# Patient Record
Sex: Female | Born: 1975 | Race: White | Hispanic: No | Marital: Married | Smoking: Former smoker
Health system: Southern US, Community
[De-identification: ages and names within clinical notes are randomized; demographics above are authoritative.]

## PROBLEM LIST (undated history)

## (undated) DIAGNOSIS — G43909 Migraine, unspecified, not intractable, without status migrainosus: Secondary | ICD-10-CM

## (undated) DIAGNOSIS — F329 Major depressive disorder, single episode, unspecified: Secondary | ICD-10-CM

## (undated) DIAGNOSIS — F419 Anxiety disorder, unspecified: Secondary | ICD-10-CM

## (undated) DIAGNOSIS — F32A Depression, unspecified: Secondary | ICD-10-CM

## (undated) HISTORY — PX: CHOLECYSTECTOMY: SHX55

---

## 2018-05-01 ENCOUNTER — Ambulatory Visit
Admission: EM | Admit: 2018-05-01 | Discharge: 2018-05-01 | Disposition: A | Discharge status: Home / Self Care | Payer: Self-pay | Attending: Family Medicine | Admitting: Family Medicine

## 2018-05-01 ENCOUNTER — Other Ambulatory Visit: Payer: Self-pay

## 2018-05-01 ENCOUNTER — Encounter: Payer: Self-pay | Admitting: Emergency Medicine

## 2018-05-01 DIAGNOSIS — R05 Cough: Secondary | ICD-10-CM

## 2018-05-01 DIAGNOSIS — R059 Cough, unspecified: Secondary | ICD-10-CM

## 2018-05-01 HISTORY — DX: Major depressive disorder, single episode, unspecified: F32.9

## 2018-05-01 HISTORY — DX: Anxiety disorder, unspecified: F41.9

## 2018-05-01 HISTORY — DX: Depression, unspecified: F32.A

## 2018-05-01 HISTORY — DX: Migraine, unspecified, not intractable, without status migrainosus: G43.909

## 2018-05-01 MED ORDER — HYDROCOD POLST-CPM POLST ER 10-8 MG/5ML PO SUER: 5.0000 mL | Freq: Every evening | ORAL | 0 refills | Status: AC | PRN

## 2018-05-01 NOTE — ED Provider Notes (Signed)
[facility]  CSN: [id number] Arrival date & time: 05/01/18  1243  History   Chief Complaint Chief Complaint  Patient presents with  . Cough    APPT   HPI  42 year old female presents with cough.  Patient reports a 4 to 5-day history of dry cough.  No fever.  No shortness of breath.  Worse at night.  She has tried over-the-counter NyQuil and Robitussin without relief.  No known exacerbating factors.  No other associated symptoms.  No other complaints.  Past Medical History:  Diagnosis Date  . Anxiety   . Depression   . Migraine    Past Surgical History:  Procedure Laterality Date  . CHOLECYSTECTOMY     OB History   None    Home Medications    Prior to Admission medications   Medication Sig Start Date End Date Taking? Authorizing Provider  Aspirin-Acetaminophen-Caffeine (EXCEDRIN EXTRA STRENGTH PO) Take 2 capsules by mouth 2 (two) times daily as needed.   Yes [provider]  clonazePAM (KLONOPIN) 0.5 MG tablet Take 0.5 mg by mouth 3 (three) times daily as needed for anxiety.   Yes [provider]  fluticasone (FLONASE) 50 MCG/ACT nasal spray Place 2 sprays into both nostrils daily as needed for allergies or rhinitis.   Yes [provider]  levonorgestrel (MIRENA) 20 MCG/24HR IUD 1 each by Intrauterine route once.   Yes [provider]  SUMAtriptan (IMITREX) 25 MG tablet Take 25 mg by mouth every 2 (two) hours as needed for migraine. May repeat in 2 hours if headache persists or recurs.   Yes [provider]  venlafaxine XR (EFFEXOR-XR) 75 MG 24 hr capsule Take 150 mg by mouth daily with breakfast.   Yes [provider]  chlorpheniramine-HYDROcodone (TUSSIONEX PENNKINETIC ER) 10-8 MG/5ML SUER Take 5 mLs by mouth at bedtime as needed. 05/01/18   Tommie Sams, DO   Family History Family History  Problem Relation Age of Onset  . Hypertension Mother   . Hyperlipidemia Mother   . Depression Mother   . Prostate  cancer Father   . Kidney cancer Father   . Skin cancer Father    Social History Social History   Tobacco Use  . Smoking status: Former Smoker    Last attempt to quit: 05/01/2014    Years since quitting: 4.0  . Smokeless tobacco: Never Used  Substance Use Topics  . Alcohol use: Never    Frequency: Never  . Drug use: Never   Allergies   Prednisone; Sulfa antibiotics; and Wellbutrin [bupropion]  Review of Systems Review of Systems  Constitutional: Negative for fever.  Respiratory: Positive for cough.    Physical Exam Triage Vital Signs ED Triage Vitals [05/01/18 1301]  Enc Vitals Group     BP (!) 135/96     Pulse Rate 91     Resp 16     Temp 98.4 F (36.9 C)     Temp Source Oral     SpO2 100 %     Weight 162 lb (73.5 kg)     Height  (1.626 m)     Head Circumference      Peak Flow      Pain Score 1     Pain Loc      Pain Edu?      Excl. in GC?    Updated Vital Signs BP (!) 135/96 (BP Location: Left Arm)   Pulse 91   Temp 98.4 F (36.9 C) (Oral)  Resp 16   Ht  (1.626 m)   Wt 162 lb (73.5 kg)   SpO2 100%   BMI 27.81 kg/m   Physical Exam  Constitutional: She is oriented to person, place, and time. She appears well-developed. No distress.  HENT:  Head: Normocephalic and atraumatic.  Mouth/Throat: Oropharynx is clear and moist.  Cardiovascular: Normal rate and regular rhythm.  Pulmonary/Chest: Effort normal and breath sounds normal. She has no wheezes. She has no rales.  Neurological: She is alert and oriented to person, place, and time.  Psychiatric: She has a normal mood and affect. Her behavior is normal.  Vitals reviewed.  UC Treatments / Results  Labs (all labs ordered are listed, but only abnormal results are displayed) Labs Reviewed - No data to display  EKG None  Radiology No results found.  Procedures Procedures (including critical care time)  Medications Ordered in UC Medications - No data to display  Initial Impression /  Assessment and Plan / UC Course  I have reviewed the triage vital signs and the nursing notes.  Pertinent labs & imaging results that were available during my care of the patient were reviewed by me and considered in my medical decision making (see chart for details).    42 year old female presents with cough.  No indication for antibiotic.  Treating with Tussionex.  Final Clinical Impressions(s) / UC Diagnoses   Final diagnoses:  Cough   Discharge Instructions   None    ED Prescriptions    Medication Sig Dispense Auth. Provider   chlorpheniramine-HYDROcodone (TUSSIONEX PENNKINETIC ER) 10-8 MG/5ML SUER Take 5 mLs by mouth at bedtime as needed. 60 mL Tommie Sams, DO     Controlled Substance Prescriptions [place] Controlled Substance Registry consulted? Not Applicable   Tommie Sams, DO 05/01/18 1438

## 2018-05-01 NOTE — ED Triage Notes (Signed)
Patient is in today c/o 4-5 day history of cough, which is worse at night. Patient has tried OTC Nyquil and Robitussin without relief. Patient has felt feverish, but hasn't had a temperature.

## 2021-02-21 ENCOUNTER — Other Ambulatory Visit: Payer: Self-pay

## 2021-02-21 ENCOUNTER — Emergency Department: Payer: Medicaid Other

## 2021-02-21 ENCOUNTER — Emergency Department
Admission: EM | Admit: 2021-02-21 | Discharge: 2021-02-21 | Disposition: A | Discharge status: Home / Self Care | Payer: Medicaid Other | Attending: Emergency Medicine | Admitting: Emergency Medicine

## 2021-02-21 DIAGNOSIS — G4486 Cervicogenic headache: Secondary | ICD-10-CM

## 2021-02-21 DIAGNOSIS — Y92009 Unspecified place in unspecified non-institutional (private) residence as the place of occurrence of the external cause: Secondary | ICD-10-CM | POA: Diagnosis not present

## 2021-02-21 DIAGNOSIS — M542 Cervicalgia: Secondary | ICD-10-CM | POA: Diagnosis not present

## 2021-02-21 DIAGNOSIS — R079 Chest pain, unspecified: Secondary | ICD-10-CM | POA: Diagnosis not present

## 2021-02-21 DIAGNOSIS — Z87891 Personal history of nicotine dependence: Secondary | ICD-10-CM | POA: Insufficient documentation

## 2021-02-21 DIAGNOSIS — Z7982 Long term (current) use of aspirin: Secondary | ICD-10-CM | POA: Diagnosis not present

## 2021-02-21 MED ORDER — ACETAMINOPHEN 325 MG PO TABS
650.0000 mg | ORAL_TABLET | Freq: Once | ORAL | Status: AC
  Administered 2021-02-21: 650 mg via ORAL
  Filled 2021-02-21: qty 2

## 2021-02-21 MED ORDER — CYCLOBENZAPRINE HCL 10 MG PO TABS
5.0000 mg | ORAL_TABLET | Freq: Once | ORAL | Status: AC
  Administered 2021-02-21: 5 mg via ORAL
  Filled 2021-02-21: qty 1

## 2021-02-21 MED ORDER — MELOXICAM 15 MG PO TABS: 15.0000 mg | ORAL_TABLET | Freq: Every day | ORAL | 0 refills | Status: AC

## 2021-02-21 MED ORDER — METHOCARBAMOL 500 MG PO TABS: 500.0000 mg | ORAL_TABLET | Freq: Four times a day (QID) | ORAL | 0 refills | Status: AC

## 2021-02-21 MED ORDER — MELOXICAM 7.5 MG PO TABS
15.0000 mg | ORAL_TABLET | Freq: Once | ORAL | Status: AC
  Administered 2021-02-21: 15 mg via ORAL
  Filled 2021-02-21: qty 2
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
## 2021-02-21 NOTE — Discharge Instructions (Signed)
Please take the antiinflammatory and muscle relaxant as prescribed. You may also take Tylenol, up to 1000 mg 4x daily. Please follow up with primary care if not improved in 1-2 weeks, or return to ER for any worsening.

## 2021-02-21 NOTE — ED Notes (Signed)
Patient declined discharge vital signs. 
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
